# Patient Record
Sex: Male | Born: 1975 | Race: White | Hispanic: No | Marital: Single | Smoking: Never smoker
Health system: Southern US, Community
[De-identification: ages and names within clinical notes are randomized; demographics above are authoritative.]

---

## 2015-08-29 ENCOUNTER — Emergency Department (HOSPITAL_COMMUNITY)
Admission: EM | Admit: 2015-08-29 | Discharge: 2015-08-29 | Disposition: A | Discharge status: Home / Self Care | Payer: Worker's Compensation | Attending: Emergency Medicine | Admitting: Emergency Medicine

## 2015-08-29 ENCOUNTER — Emergency Department (HOSPITAL_COMMUNITY): Payer: Worker's Compensation

## 2015-08-29 ENCOUNTER — Encounter (HOSPITAL_COMMUNITY): Payer: Self-pay | Admitting: *Deleted

## 2015-08-29 DIAGNOSIS — S8992XA Unspecified injury of left lower leg, initial encounter: Secondary | ICD-10-CM | POA: Diagnosis not present

## 2015-08-29 DIAGNOSIS — Y9289 Other specified places as the place of occurrence of the external cause: Secondary | ICD-10-CM | POA: Insufficient documentation

## 2015-08-29 DIAGNOSIS — Y9389 Activity, other specified: Secondary | ICD-10-CM | POA: Diagnosis not present

## 2015-08-29 DIAGNOSIS — S8251XA Displaced fracture of medial malleolus of right tibia, initial encounter for closed fracture: Secondary | ICD-10-CM | POA: Insufficient documentation

## 2015-08-29 DIAGNOSIS — Y990 Civilian activity done for income or pay: Secondary | ICD-10-CM | POA: Insufficient documentation

## 2015-08-29 DIAGNOSIS — S8991XA Unspecified injury of right lower leg, initial encounter: Secondary | ICD-10-CM | POA: Diagnosis present

## 2015-08-29 DIAGNOSIS — S82891A Other fracture of right lower leg, initial encounter for closed fracture: Secondary | ICD-10-CM

## 2015-08-29 MED ORDER — FENTANYL CITRATE (PF) 100 MCG/2ML IJ SOLN
INTRAMUSCULAR | Status: AC
  Administered 2015-08-29: 50 ug
  Filled 2015-08-29: qty 4

## 2015-08-29 MED ORDER — OXYCODONE-ACETAMINOPHEN 5-325 MG PO TABS: 2.0000 | ORAL_TABLET | ORAL | Status: AC | PRN

## 2015-08-29 MED ORDER — FENTANYL CITRATE (PF) 100 MCG/2ML IJ SOLN: 100.0000 ug | Freq: Once | INTRAMUSCULAR | Status: DC

## 2015-08-29 MED ORDER — TETANUS-DIPHTHERIA TOXOIDS TD 5-2 LFU IM INJ
0.5000 mL | INJECTION | Freq: Once | INTRAMUSCULAR | Status: AC
  Administered 2015-08-29: 0.5 mL via INTRAMUSCULAR
  Filled 2015-08-29: qty 0.5
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
## 2015-08-29 NOTE — Discharge Instructions (Signed)
Cast or Splint Care  Casts and splints support injured limbs and keep bones from moving while they heal.   HOME CARE  · Keep the cast or splint uncovered during the drying period.  ¨ A plaster cast can take 24 to 48 hours to dry.  ¨ A fiberglass cast will dry in less than 1 hour.  · Do not rest the cast on anything harder than a pillow for 24 hours.  · Do not put weight on your injured limb. Do not put pressure on the cast. Wait for your doctor's approval.  · Keep the cast or splint dry.  ¨ Cover the cast or splint with a plastic bag during baths or wet weather.  ¨ If you have a cast over your chest and belly (trunk), take sponge baths until the cast is taken off.  ¨ If your cast gets wet, dry it with a towel or blow dryer. Use the cool setting on the blow dryer.  · Keep your cast or splint clean. Wash a dirty cast with a damp cloth.  · Do not put any objects under your cast or splint.  · Do not scratch the skin under the cast with an object. If itching is a problem, use a blow dryer on a cool setting over the itchy area.  · Do not trim or cut your cast.  · Do not take out the padding from inside your cast.  · Exercise your joints near the cast as told by your doctor.  · Raise (elevate) your injured limb on 1 or 2 pillows for the first 1 to 3 days.  GET HELP IF:  · Your cast or splint cracks.  · Your cast or splint is too tight or too loose.  · You itch badly under the cast.  · Your cast gets wet or has a soft spot.  · You have a bad smell coming from the cast.  · You get an object stuck under the cast.  · Your skin around the cast becomes red or sore.  · You have new or more pain after the cast is put on.  GET HELP RIGHT AWAY IF:  · You have fluid leaking through the cast.  · You cannot move your fingers or toes.  · Your fingers or toes turn blue or white or are cool, painful, or puffy (swollen).  · You have tingling or lose feeling (numbness) around the injured area.  · You have bad pain or pressure under the  cast.  · You have trouble breathing or have shortness of breath.  · You have chest pain.     This information is not intended to replace advice given to you by your health care provider. Make sure you discuss any questions you have with your health care provider.     Document Released: 11/20/2010 Document Revised: 03/23/2013 Document Reviewed: 01/27/2013  Elsevier Interactive Patient Education ©2016 [employer]      Tibial and Fibular Fracture, Adult  Tibial and fibular fracture is a break in the bones of your lower leg (tibia and fibula). The tibia is the larger of these two bones. The fibula is the smaller of the two bones. It is on the outer side of your leg.   CAUSES  · Low-energy injuries, such as a fall from ground level.  · High-energy injuries, such as motor vehicle injuries, gunshot wounds, or high-speed sports collisions.  RISK FACTORS  · Jumping activities.  · Repetitive stress, such as   long-distance running.  · Participation in sports.  · Osteoporosis.  · Advanced age.  SIGNS AND SYMPTOMS  · Pain.  · Swelling.  · Inability to put weight on your injured leg.  · Bone deformities at the site of your injury.  · Bruising.  DIAGNOSIS   Tibial and fibular fractures are diagnosed with the use of X-ray exams.  TREATMENT   If you have a simple fracture of these two bones, they can be treated with simple immobilization. A cast or splint will be used on your leg to keep it from moving while it heals. Then you can begin range-of-motion exercises to regain your knee motion.  HOME CARE INSTRUCTIONS   · Apply ice to your leg:    Put ice in a plastic bag.    Place a towel between your skin and the bag.    Leave the ice on for 20 minutes, 2-3 times a day.  · If you have a plaster or fiberglass cast:    Do not try to scratch the skin under the cast using sharp or pointed objects.    Check the skin around the cast every day. You may put lotion on any red or sore areas.    Keep your cast dry and clean.  · If you have a  plaster splint:    Wear the splint as directed.    You may loosen the elastic around the splint if your toes become numb, tingle, or turn cold or blue.  · Do not put pressure on any part of your cast or splint until it is fully hardened, because it may deform.  · Your cast or splint can be protected during bathing with a plastic bag. Do not lower the cast or splint into water.  · Use crutches as directed.  · Only take over-the-counter or prescription medicines for pain, discomfort, or fever as directed by your health care provider.  · Follow all instructions given to you by your health care provider.  · Make and keep all follow-up appointments.  SEEK MEDICAL CARE IF:  · Your pain is becoming worse rather than better or is not controlled with medicines.  · You have increased swelling or redness in the foot.  · You begin to lose feeling in your foot or toes.  SEEK IMMEDIATE MEDICAL CARE IF:  · You develop a cold or blue foot or toes on the injured side.  · You develop severe pain in your injured leg, especially if the pain is increased with movement of your toes.  MAKE SURE YOU:  · Understand these instructions.  · Will watch your condition.  · Will get help right away if you are not doing well or get worse.     This information is not intended to replace advice given to you by your health care provider. Make sure you discuss any questions you have with your health care provider.     Document Released: 04/12/2002 Document Revised: 12/05/2014 Document Reviewed: 03/02/2013  Elsevier Interactive Patient Education ©2016 [employer]

## 2015-08-29 NOTE — ED Notes (Signed)
Family at bedside. 

## 2015-08-29 NOTE — ED Notes (Signed)
This nurse was trying to finish triage, his phone rings and holds a finger up and tells this nurse to wait.
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
